# Patient Record
Sex: Female | Born: 1985 | Race: White | Hispanic: No | Marital: Married | Smoking: Never smoker
Health system: Southern US, Community
[De-identification: ages and names within clinical notes are randomized; demographics above are authoritative.]

## PROBLEM LIST (undated history)

## (undated) DIAGNOSIS — F419 Anxiety disorder, unspecified: Secondary | ICD-10-CM

## (undated) HISTORY — PX: NO PAST SURGERIES: SHX2092

---

## 2020-01-23 LAB — OB RESULTS CONSOLE GBS: GBS: POSITIVE

## 2020-07-18 LAB — OB RESULTS CONSOLE RUBELLA ANTIBODY, IGM: Rubella: IMMUNE

## 2020-07-18 LAB — OB RESULTS CONSOLE HEPATITIS B SURFACE ANTIGEN: Hepatitis B Surface Ag: NEGATIVE

## 2020-07-18 LAB — OB RESULTS CONSOLE HIV ANTIBODY (ROUTINE TESTING): HIV: NONREACTIVE

## 2020-07-18 LAB — OB RESULTS CONSOLE GC/CHLAMYDIA
Chlamydia: NEGATIVE
Gonorrhea: NEGATIVE

## 2020-07-23 ENCOUNTER — Inpatient Hospital Stay (HOSPITAL_COMMUNITY): Admit: 2020-07-23 | Payer: BC Managed Care – PPO | Admitting: Obstetrics & Gynecology

## 2020-11-16 NOTE — L&D Delivery Note (Signed)
Delivery Note At 11:20 AM a viable female was delivered via Vaginal, Spontaneous (Presentation: Left Occiput Anterior).  APGAR: pend; weight  .   Placenta status: Spontaneous, Intact.  Cord: 3 vessels with the following complications: None.  Cord pH: pend.  Uterus manually explored, clear of all clot and debris  Anesthesia:  epidural Episiotomy:   Lacerations:  1st degree, periurethral (hemostatic, no suture required) Suture Repair: 3.0 Est. Blood Loss (mL): 150 cc   Mom to postpartum.  Baby to Couplet care / Skin to Skin.  [name] 02/11/2021, 11:45 AM

## 2020-12-31 ENCOUNTER — Encounter (HOSPITAL_COMMUNITY): Payer: Self-pay

## 2020-12-31 ENCOUNTER — Inpatient Hospital Stay (HOSPITAL_BASED_OUTPATIENT_CLINIC_OR_DEPARTMENT_OTHER): Payer: BC Managed Care – PPO

## 2020-12-31 ENCOUNTER — Inpatient Hospital Stay (HOSPITAL_COMMUNITY)
Admission: AD | Admit: 2020-12-31 | Discharge: 2020-12-31 | Disposition: A | Discharge status: Home / Self Care | Payer: BC Managed Care – PPO | Attending: Obstetrics and Gynecology | Admitting: Obstetrics and Gynecology

## 2020-12-31 ENCOUNTER — Other Ambulatory Visit: Payer: Self-pay

## 2020-12-31 DIAGNOSIS — F419 Anxiety disorder, unspecified: Secondary | ICD-10-CM | POA: Diagnosis not present

## 2020-12-31 DIAGNOSIS — O368130 Decreased fetal movements, third trimester, not applicable or unspecified: Secondary | ICD-10-CM

## 2020-12-31 DIAGNOSIS — O99343 Other mental disorders complicating pregnancy, third trimester: Secondary | ICD-10-CM | POA: Diagnosis not present

## 2020-12-31 DIAGNOSIS — Z3A33 33 weeks gestation of pregnancy: Secondary | ICD-10-CM

## 2020-12-31 DIAGNOSIS — O368190 Decreased fetal movements, unspecified trimester, not applicable or unspecified: Secondary | ICD-10-CM

## 2020-12-31 HISTORY — DX: Anxiety disorder, unspecified: F41.9

## 2020-12-31 NOTE — Discharge Instructions (Signed)
Fetal Movement Counts Patient Name: ________________________________________________ Patient Due Date: ____________________  What is a fetal movement count? A fetal movement count is the number of times that you feel your baby move during a certain amount of time. This may also be called a fetal kick count. A fetal movement count is recommended for every pregnant woman. You may be asked to start counting fetal movements as early as week 28 of your pregnancy. Pay attention to when your baby is most active. You may notice your baby's sleep and wake cycles. You may also notice things that make your baby move more. You should do a fetal movement count:  When your baby is normally most active.  At the same time each day. A good time to count movements is while you are resting, after having something to eat and drink. How do I count fetal movements? 1. Find a quiet, comfortable area. Sit, or lie down on your side. 2. Write down the date, the start time and stop time, and the number of movements that you felt between those two times. Take this information with you to your health care visits. 3. Write down your start time when you feel the first movement. 4. Count kicks, flutters, swishes, rolls, and jabs. You should feel at least 10 movements. 5. You may stop counting after you have felt 10 movements, or if you have been counting for 2 hours. Write down the stop time. 6. If you do not feel 10 movements in 2 hours, contact your health care provider for further instructions. Your health care provider may want to do additional tests to assess your baby's well-being. Contact a health care provider if:  You feel fewer than 10 movements in 2 hours.  Your baby is not moving like he or she usually does. Date: ____________ Start time: ____________ Stop time: ____________ Movements: ____________ Date: ____________ Start time: ____________ Stop time: ____________ Movements: ____________ Date: ____________  Start time: ____________ Stop time: ____________ Movements: ____________ Date: ____________ Start time: ____________ Stop time: ____________ Movements: ____________ Date: ____________ Start time: ____________ Stop time: ____________ Movements: ____________ Date: ____________ Start time: ____________ Stop time: ____________ Movements: ____________ Date: ____________ Start time: ____________ Stop time: ____________ Movements: ____________ Date: ____________ Start time: ____________ Stop time: ____________ Movements: ____________ Date: ____________ Start time: ____________ Stop time: ____________ Movements: ____________ This information is not intended to replace advice given to you by your health care provider. Make sure you discuss any questions you have with your health care provider. Document Revised: 06/22/2019 Document Reviewed: 06/22/2019 Elsevier Patient Education  2021 Elsevier Inc.        [place].czhttps://www.nichd.nih.gov/health/topics/labor-delivery/Pages/default.aspx">  Third Trimester of Pregnancy  The third trimester of pregnancy is from week 28 through week 40. This is months 7 through 9. The third trimester is a time when the unborn baby (fetus) is growing rapidly. At the end of the ninth month, the fetus is about 20 inches long and weighs 6-10 pounds. Body changes during your third trimester During the third trimester, your body will continue to go through many changes. The changes vary and generally return to normal after your baby is born. Physical changes  Your weight will continue to increase. You can expect to gain 25-35 pounds (11-16 kg) by the end of the pregnancy if you begin pregnancy at a normal weight. If you are underweight, you can expect to gain 28-40 lb (about 13-18 kg), and if you are overweight, you can expect to gain 15-25 lb (about 7-11 kg).  You may begin to get stretch marks on your hips, abdomen, and breasts.  Your breasts will continue to grow and may hurt.  A yellow fluid (colostrum) may leak from your breasts. This is the first milk you are producing for your baby.  You may have changes in your hair. These can include thickening of your hair, rapid growth, and changes in texture. Some people also have hair loss during or after pregnancy, or hair that feels dry or thin.  Your belly button may stick out.  You may notice more swelling in your hands, face, or ankles. Health changes  You may have heartburn.  You may have constipation.  You may develop hemorrhoids.  You may develop swollen, bulging veins (varicose veins) in your legs.  You may have increased body aches in the pelvis, back, or thighs. This is due to weight gain and increased hormones that are relaxing your joints.  You may have increased tingling or numbness in your hands, arms, and legs. The skin on your abdomen may also feel numb.  You may feel short of breath because of your expanding uterus. Other changes  You may urinate more often because the fetus is moving lower into your pelvis and pressing on your bladder.  You may have more problems sleeping. This may be caused by the size of your abdomen, an increased need to urinate, and an increase in your body's metabolism.  You may notice the fetus "dropping," or moving lower in your abdomen (lightening).  You may have increased vaginal discharge.  You may notice that you have pain around your pelvic bone as your uterus distends. Follow these instructions at home: Medicines  Follow your health care provider's instructions regarding medicine use. Specific medicines may be either safe or unsafe to take during pregnancy. Do not take any medicines unless approved by your health care provider.  Take a prenatal vitamin that contains at least 600 micrograms (mcg) of folic acid. Eating and drinking  Eat a healthy diet that includes fresh fruits and vegetables, whole grains, good sources of protein such as meat, eggs, or tofu,  and low-fat dairy products.  Avoid raw meat and unpasteurized juice, milk, and cheese. These carry germs that can harm you and your baby.  Eat 4 or 5 small meals rather than 3 large meals a day.  You may need to take these actions to prevent or treat constipation: ? Drink enough fluid to keep your urine pale yellow. ? Eat foods that are high in fiber, such as beans, whole grains, and fresh fruits and vegetables. ? Limit foods that are high in fat and processed sugars, such as fried or sweet foods. Activity  Exercise only as directed by your health care provider. Most people can continue their usual exercise routine during pregnancy. Try to exercise for 30 minutes at least 5 days a week. Stop exercising if you experience contractions in the uterus.  Stop exercising if you develop pain or cramping in the lower abdomen or lower back.  Avoid heavy lifting.  Do not exercise if it is very hot or humid or if you are at a high altitude.  If you choose to, you may continue to have sex unless your health care provider tells you not to. Relieving pain and discomfort  Take frequent breaks and rest with your legs raised (elevated) if you have leg cramps or low back pain.  Take warm sitz baths to soothe any pain or discomfort caused by hemorrhoids. Use hemorrhoid cream  if your health care provider approves.  Wear a supportive bra to prevent discomfort from breast tenderness.  If you develop varicose veins: ? Wear support hose as told by your health care provider. ? Elevate your feet for 15 minutes, 3-4 times a day. ? Limit salt in your diet. Safety  Talk to your health care provider before traveling far distances.  Do not use hot tubs, steam rooms, or saunas.  Wear your seat belt at all times when driving or riding in a car.  Talk with your health care provider if someone is verbally or physically abusive to you. Preparing for birth To prepare for the arrival of your baby:  Take  prenatal classes to understand, practice, and ask questions about labor and delivery.  Visit the hospital and tour the maternity area.  Purchase a rear-facing car seat and make sure you know how to install it in your car.  Prepare the baby's room or sleeping area. Make sure to remove all pillows and stuffed animals from the baby's crib to prevent suffocation. General instructions  Avoid cat litter boxes and soil used by cats. These carry germs that can cause birth defects in the baby. If you have a cat, ask someone to clean the litter box for you.  Do not douche or use tampons. Do not use scented sanitary pads.  Do not use any products that contain nicotine or tobacco, such as cigarettes, e-cigarettes, and chewing tobacco. If you need help quitting, ask your health care provider.  Do not use any herbal remedies, illegal drugs, or medicines that were not prescribed to you. Chemicals in these products can harm your baby.  Do not drink alcohol.  You will have more frequent prenatal exams during the third trimester. During a routine prenatal visit, your health care provider will do a physical exam, perform tests, and discuss your overall health. Keep all follow-up visits. This is important. Where to find more information  American Pregnancy Association: americanpregnancy.org  [employer]: https://www.todd-brady.net/  Office on [employer] Health: Might[employer]ard.co.nz Contact a health care provider if you have:  A fever.  Mild pelvic cramps, pelvic pressure, or nagging pain in your abdominal area or lower back.  Vomiting or diarrhea.  Bad-smelling vaginal discharge or foul-smelling urine.  Pain when you urinate.  A headache that does not go away when you take medicine.  Visual changes or see spots in front of your eyes. Get help right away if:  Your water breaks.  You have regular contractions less than 5 minutes  apart.  You have spotting or bleeding from your vagina.  You have severe abdominal pain.  You have difficulty breathing.  You have chest pain.  You have fainting spells.  You have not felt your baby move for the time period told by your health care provider.  You have new or increased pain, swelling, or redness in an arm or leg. Summary  The third trimester of pregnancy is from week 28 through week 40 (months 7 through 9).  You may have more problems sleeping. This can be caused by the size of your abdomen, an increased need to urinate, and an increase in your body's metabolism.  You will have more frequent prenatal exams during the third trimester. Keep all follow-up visits. This is important. This information is not intended to replace advice given to you by your health care provider. Make sure you discuss any questions you have with your health care provider. Document Revised: 04/10/2020  Document Reviewed: 02/15/2020 Elsevier Patient Education  2021 [employer].

## 2020-12-31 NOTE — MAU Note (Signed)
Presents with c/o decreased FM since yesterday.  States has felt movement, less than usual.  Denies VB or LOF.

## 2020-12-31 NOTE — MAU Provider Note (Signed)
History     CSN: [id number]  Arrival date and time: 12/31/20 1743   Event Date/Time   First Provider Initiated Contact with Patient 12/31/20 1839      Chief Complaint  Patient presents with  . Decreased Fetal Movement   Veronica Gutierrez is a 35 y.o. G5P1031 at [redacted]w[redacted]d who presents to MAU for feeling decreased fetal movement beginning today. Patient reports she does do fetal kick counts at home and was told to count 10 movements in 1 hour, but patient was concerned because the baby normally moves 10 times in 15 minutes, so she came in to MAU for evaluation. Patient reports almost normal return to fetal movement upon provider entering room.  Current medications/supplements: sertraline, Unisom QHS, PNVs Anterior placenta? unsure Doing FKCs? yes Problems this pregnancy include: none Pt denies prior instances of DFM. Pt denies all risk factors for stillbirth, including, but not limited to: IUGR, placental abruption, infection, genetic/congenital anomalies, fetomaternal hemorrhage, DM, HTN, smoking/drug use, umbilical cord/placental abnormalities, uterine abnormalities, fetal hydrops, arrythmia, platelet dysfunction, IHCP.  Pt denies VB, LOF, ctx, vaginal discharge/odor/itching.   OB History    Gravida  5   Para  1   Term  1   Preterm      AB  3   Living  1     SAB  3   IAB      Ectopic      Multiple      Live Births  1           Past Medical History:  Diagnosis Date  . Anxiety     Past Surgical History:  Procedure Laterality Date  . NO PAST SURGERIES      Family History  Problem Relation Age of Onset  . Cancer Mother        Breast  . Healthy Father     Social History   Tobacco Use  . Smoking status: Never Smoker  . Smokeless tobacco: Never Used  Vaping Use  . Vaping Use: Never used  Substance Use Topics  . Alcohol use: Not Currently    Comment: Social  . Drug use: Never    Allergies: Not on File  Medications Prior to Admission   Medication Sig Dispense Refill Last Dose  . doxylamine, Sleep, (UNISOM) 25 MG tablet Take 25 mg by mouth at bedtime as needed.   12/30/2020 at Unknown time  . Prenatal Vit-Fe Fumarate-FA (MULTIVITAMIN-PRENATAL) 27-0.8 MG TABS tablet Take 1 tablet by mouth daily at 12 noon.   12/30/2020 at Unknown time  . sertraline (ZOLOFT) 50 MG tablet Take 50 mg by mouth daily.   12/30/2020 at Unknown time    Review of Systems  Constitutional: Negative for chills, diaphoresis, fatigue and fever.  Eyes: Negative for visual disturbance.  Respiratory: Negative for shortness of breath.   Cardiovascular: Negative for chest pain.  Gastrointestinal: Negative for abdominal pain, constipation, diarrhea, nausea and vomiting.  Genitourinary: Negative for dysuria, flank pain, frequency, pelvic pain, urgency, vaginal bleeding and vaginal discharge.  Neurological: Negative for dizziness, weakness, light-headedness and headaches.   Physical Exam   Blood pressure 102/73, pulse 98, temperature 97.9 F (36.6 C), temperature source Oral, resp. rate 20, SpO2 98 %.  Physical Exam Vitals and nursing note reviewed.  Constitutional:      General: She is not in acute distress.    Appearance: Normal appearance. She is not ill-appearing, toxic-appearing or diaphoretic.  HENT:     Head: Normocephalic and atraumatic.  Pulmonary:  Effort: Pulmonary effort is normal.  Neurological:     Mental Status: She is alert and oriented to person, place, and time.  Psychiatric:        Mood and Affect: Mood normal.        Behavior: Behavior normal.        Thought Content: Thought content normal.        Judgment: Judgment normal.    No results found for this or any previous visit (from the past 24 hour(s)).  No results found.  MAU Course  Procedures  MDM -DFM since this morning, with near return to normal fetal movement upon provider entering room -clicker given to patient to record fetal movement, patient pressed button 23  times in 36 minutes -EFM: reactive       -baseline: 145       -variability: moderate       -accels: present, 15x15       -decels: absent       -TOCO: quiet -BPP ordered, 8/8 -pt discharged to home in stable condition  Orders Placed This Encounter  Procedures  . [place] MFM FETAL BPP WO NON STRESS    Standing Status:   Standing    Number of Occurrences:   1    Order Specific Question:   Symptom/Reason for Exam    Answer:   Decreased fetal movement [[id number]]  . Discharge patient    Order Specific Question:   Discharge disposition    Answer:   01-Home or Self Care [1]    Order Specific Question:   Discharge patient date    Answer:   12/31/2020   No orders of the defined types were placed in this encounter.  Assessment and Plan   1. Decreased fetal movements in third trimester, single or unspecified fetus   2. Decreased fetal movement   3. [redacted] weeks gestation of pregnancy    Allergies as of 12/31/2020   Not on File     Medication List    TAKE these medications   doxylamine (Sleep) 25 MG tablet Commonly known as: UNISOM Take 25 mg by mouth at bedtime as needed.   multivitamin-prenatal 27-0.8 MG Tabs tablet Take 1 tablet by mouth daily at 12 noon.   sertraline 50 MG tablet Commonly known as: ZOLOFT Take 50 mg by mouth daily.      -discussed that mothers do not perceive 100% of fetal movement, and there is wide variation of mother perception of fetal movement -discussed that fetal movement varies somewhat depending on the time of day and gestational age and there is a wide variety of normal movement among healthy babies -discussed that frequency of movement typically increases from morning to night, with peak activity late at night -discussed that fetal sleep cycles become longer with advancing gestation and can last about 20-53minutes -pt advised to contact HCP immediately if noticing a decrease in fetal movement compared to what is normal -discussed FKCs vs. monitoring  movements; can either do fetal kick counting, or just be aware of what is normal for baby in terms of movement; no one method is better than the other -FKC: at least 10 fetal movements (FMs) over up to two hours when at rest and focused on counting -return MAU precautions given -pt discharged to home in stable condition  Joni Reining E Nugent 12/31/2020, 7:30 PM

## 2021-01-22 LAB — OB RESULTS CONSOLE GBS: GBS: POSITIVE

## 2021-02-07 ENCOUNTER — Other Ambulatory Visit: Payer: Self-pay

## 2021-02-07 ENCOUNTER — Inpatient Hospital Stay (HOSPITAL_COMMUNITY)
Admission: AD | Admit: 2021-02-07 | Discharge: 2021-02-07 | Disposition: A | Discharge status: Home / Self Care | Payer: BC Managed Care – PPO | Attending: Obstetrics and Gynecology | Admitting: Obstetrics and Gynecology

## 2021-02-07 ENCOUNTER — Encounter (HOSPITAL_COMMUNITY): Payer: Self-pay | Admitting: Obstetrics and Gynecology

## 2021-02-07 DIAGNOSIS — Z3A38 38 weeks gestation of pregnancy: Secondary | ICD-10-CM | POA: Diagnosis not present

## 2021-02-07 DIAGNOSIS — Z0371 Encounter for suspected problem with amniotic cavity and membrane ruled out: Secondary | ICD-10-CM | POA: Diagnosis present

## 2021-02-07 LAB — POCT FERN TEST: POCT Fern Test: NEGATIVE

## 2021-02-07 NOTE — Discharge Instructions (Signed)
Fetal Movement Counts Patient Name: ________________________________________________ Patient Due Date: ____________________  What is a fetal movement count? A fetal movement count is the number of times that you feel your baby move during a certain amount of time. This may also be called a fetal kick count. A fetal movement count is recommended for every pregnant woman. You may be asked to start counting fetal movements as early as week 28 of your pregnancy. Pay attention to when your baby is most active. You may notice your baby's sleep and wake cycles. You may also notice things that make your baby move more. You should do a fetal movement count:  When your baby is normally most active.  At the same time each day. A good time to count movements is while you are resting, after having something to eat and drink. How do I count fetal movements? 1. Find a quiet, comfortable area. Sit, or lie down on your side. 2. Write down the date, the start time and stop time, and the number of movements that you felt between those two times. Take this information with you to your health care visits. 3. Write down your start time when you feel the first movement. 4. Count kicks, flutters, swishes, rolls, and jabs. You should feel at least 10 movements. 5. You may stop counting after you have felt 10 movements, or if you have been counting for 2 hours. Write down the stop time. 6. If you do not feel 10 movements in 2 hours, contact your health care provider for further instructions. Your health care provider may want to do additional tests to assess your baby's well-being. Contact a health care provider if:  You feel fewer than 10 movements in 2 hours.  Your baby is not moving like he or she usually does. Date: ____________ Start time: ____________ Stop time: ____________ Movements: ____________ Date: ____________ Start time: ____________ Stop time: ____________ Movements: ____________ Date: ____________  Start time: ____________ Stop time: ____________ Movements: ____________ Date: ____________ Start time: ____________ Stop time: ____________ Movements: ____________ Date: ____________ Start time: ____________ Stop time: ____________ Movements: ____________ Date: ____________ Start time: ____________ Stop time: ____________ Movements: ____________ Date: ____________ Start time: ____________ Stop time: ____________ Movements: ____________ Date: ____________ Start time: ____________ Stop time: ____________ Movements: ____________ Date: ____________ Start time: ____________ Stop time: ____________ Movements: ____________ This information is not intended to replace advice given to you by your health care provider. Make sure you discuss any questions you have with your health care provider. Document Revised: 06/22/2019 Document Reviewed: 06/22/2019 Elsevier Patient Education  2021 [employer] Signs and Symptoms of Labor Labor is the body's natural process of moving the baby and the placenta out of the uterus. The process of labor usually starts when the baby is full-term, between 37 and 40 weeks of pregnancy. Signs and symptoms that you are close to going into labor As your body prepares for labor and the birth of your baby, you may notice the following symptoms in the weeks and days before true labor starts:  Passing a small amount of thick, bloody mucus from your vagina. This is called normal bloody show or losing your mucus plug. This may happen more than a week before labor begins, or right before labor begins, as the opening of the cervix starts to widen (dilate). For some women, the entire mucus plug passes at once. For others, pieces of the mucus plug may gradually pass over several days.  Your baby moving (dropping) lower in your pelvis   to get into position for birth (lightening). When this happens, you may feel more pressure on your bladder and pelvic bone and less pressure on your ribs. This  may make it easier to breathe. It may also cause you to need to urinate more often and have problems with bowel movements.  Having "practice contractions," also called Braxton Hicks contractions or false labor. These occur at irregular (unevenly spaced) intervals that are more than 10 minutes apart. False labor contractions are common after exercise or sexual activity. They will stop if you change position, rest, or drink fluids. These contractions are usually mild and do not get stronger over time. They may feel like: ? A backache or back pain. ? Mild cramps, similar to menstrual cramps. ? Tightening or pressure in your abdomen. Other early symptoms include:  Nausea or loss of appetite.  Diarrhea.  Having a sudden burst of energy, or feeling very tired.  Mood changes.  Having trouble sleeping.   Signs and symptoms that labor has begun Signs that you are in labor may include:  Having contractions that come at regular (evenly spaced) intervals and increase in intensity. This may feel like more intense tightening or pressure in your abdomen that moves to your back. ? Contractions may also feel like rhythmic pain in your upper thighs or back that comes and goes at regular intervals. ? For first-time mothers, this change in intensity of contractions often occurs at a more gradual pace. ? Women who have given birth before may notice a more rapid progression of contraction changes.  Feeling pressure in the vaginal area.  Your water breaking (rupture of membranes). This is when the sac of fluid that surrounds your baby breaks. Fluid leaking from your vagina may be clear or blood-tinged. Labor usually starts within 24 hours of your water breaking, but it may take longer to begin. ? Some women may feel a sudden gush of fluid. ? Others notice that their underwear repeatedly becomes damp. Follow these instructions at home:  When labor starts, or if your water breaks, call your health care  provider or nurse care line. Based on your situation, they will determine when you should go in for an exam.  During early labor, you may be able to rest and manage symptoms at home. Some strategies to try at home include: ? Breathing and relaxation techniques. ? Taking a warm bath or shower. ? Listening to music. ? Using a heating pad on the lower back for pain. If you are directed to use heat:  Place a towel between your skin and the heat source.  Leave the heat on for 20-30 minutes.  Remove the heat if your skin turns bright red. This is especially important if you are unable to feel pain, heat, or cold. You may have a greater risk of getting burned.   Contact a health care provider if:  Your labor has started.  Your water breaks. Get help right away if:  You have painful, regular contractions that are 5 minutes apart or less.  Labor starts before you are [redacted] weeks along in your pregnancy.  You have a fever.  You have bright red blood coming from your vagina.  You do not feel your baby moving.  You have a severe headache with or without vision problems.  You have severe nausea, vomiting, or diarrhea.  You have chest pain or shortness of breath. These symptoms may represent a serious problem that is an emergency. Do not wait to see   if the symptoms will go away. Get medical help right away. Call your local emergency services (911 in the [place] Do not drive yourself to the hospital. Summary  Labor is your body's natural process of moving your baby and the placenta out of your uterus.  The process of labor usually starts when your baby is full-term, between 37 and 40 weeks of pregnancy.  When labor starts, or if your water breaks, call your health care provider or nurse care line. Based on your situation, they will determine when you should go in for an exam. This information is not intended to replace advice given to you by your health care provider. Make sure you discuss  any questions you have with your health care provider. Document Revised: 08/24/2020 Document Reviewed: 08/24/2020 Elsevier Patient Education  2021 Elsevier Inc.  

## 2021-02-07 NOTE — MAU Provider Note (Signed)
Event Date/Time   First Provider Initiated Contact with Patient 02/07/21 1002       S: Ms. Antonela Freiman is a 35 y.o. G5P1031 at [redacted]w[redacted]d  who presents to MAU today complaining of leaking of fluid since this morning. She denies vaginal bleeding. She denies contractions. She reports normal fetal movement.    O: BP 112/76   Pulse (!) 110   SpO2 99%  GENERAL: Well-developed, well-nourished female in no acute distress.  HEAD: Normocephalic, atraumatic.  CHEST: Normal effort of breathing, regular heart rate ABDOMEN: Soft, nontender, gravid PELVIC: Normal external female genitalia. Vagina is pink and rugated. Cervix with normal contour, no lesions. Normal discharge.  No pooling.   Cervical exam: deferred     Fetal Monitoring: Baseline: 140 Variability: moderate Accelerations: 15x15 Decelerations: none Contractions: none  Results for orders placed or performed during the hospital encounter of 02/07/21 (from the past 24 hour(s))  Fern Test     Status: Normal   Collection Time: 02/07/21 10:10 AM  Result Value Ref Range   POCT Fern Test Negative = intact amniotic membranes      A: SIUP at [redacted]w[redacted]d  Membranes intact  P: Discharged home with return precautions  Judeth Horn, NP 02/07/2021 10:11 AM

## 2021-02-07 NOTE — MAU Note (Signed)
...  Veronica Gutierrez is a 35 y.o. at [redacted]w[redacted]d here in MAU reporting: she woke up at 0630 this morning and noticed she had leaked fluids. She reports she has leaked once since then. Clear fluids, no odor. +FM but feels as if it is less but reports "I have been running around and not paying attention much." No VB. No CTX. Endorses a HA.  Pain score: 2/10 headache FHR: External 145

## 2021-02-11 ENCOUNTER — Inpatient Hospital Stay (HOSPITAL_COMMUNITY): Payer: BC Managed Care – PPO | Admitting: Anesthesiology

## 2021-02-11 ENCOUNTER — Encounter (HOSPITAL_COMMUNITY): Payer: Self-pay | Admitting: Obstetrics and Gynecology

## 2021-02-11 ENCOUNTER — Inpatient Hospital Stay (HOSPITAL_COMMUNITY)
Admission: AD | Admit: 2021-02-11 | Discharge: 2021-02-12 | DRG: 807 | Disposition: A | Discharge status: Home / Self Care | Payer: BC Managed Care – PPO | Attending: Obstetrics and Gynecology | Admitting: Obstetrics and Gynecology

## 2021-02-11 DIAGNOSIS — Z3A39 39 weeks gestation of pregnancy: Secondary | ICD-10-CM

## 2021-02-11 DIAGNOSIS — O99824 Streptococcus B carrier state complicating childbirth: Principal | ICD-10-CM | POA: Diagnosis present

## 2021-02-11 DIAGNOSIS — O26893 Other specified pregnancy related conditions, third trimester: Secondary | ICD-10-CM | POA: Diagnosis present

## 2021-02-11 LAB — CBC
HCT: 36.2 % (ref 36.0–46.0)
Hemoglobin: 12.7 g/dL (ref 12.0–15.0)
MCH: 31.4 pg (ref 26.0–34.0)
MCHC: 35.1 g/dL (ref 30.0–36.0)
MCV: 89.6 fL (ref 80.0–100.0)
Platelets: 172 10*3/uL (ref 150–400)
RBC: 4.04 MIL/uL (ref 3.87–5.11)
RDW: 13.2 % (ref 11.5–15.5)
WBC: 7.7 10*3/uL (ref 4.0–10.5)
nRBC: 0 % (ref 0.0–0.2)

## 2021-02-11 LAB — TYPE AND SCREEN
ABO/RH(D): O POS
Antibody Screen: NEGATIVE

## 2021-02-11 LAB — RPR: RPR Ser Ql: NONREACTIVE

## 2021-02-11 MED ORDER — ONDANSETRON HCL 4 MG/2ML IJ SOLN: 4.0000 mg | INTRAMUSCULAR | Status: DC | PRN

## 2021-02-11 MED ORDER — FENTANYL CITRATE (PF) 100 MCG/2ML IJ SOLN
100.0000 ug | INTRAMUSCULAR | Status: DC | PRN
  Administered 2021-02-11: 100 ug via INTRAVENOUS
  Filled 2021-02-11: qty 2

## 2021-02-11 MED ORDER — DIPHENHYDRAMINE HCL 50 MG/ML IJ SOLN: 12.5000 mg | INTRAMUSCULAR | Status: DC | PRN

## 2021-02-11 MED ORDER — LACTATED RINGERS IV SOLN: INTRAVENOUS | Status: DC

## 2021-02-11 MED ORDER — ZOLPIDEM TARTRATE 5 MG PO TABS: 5.0000 mg | ORAL_TABLET | Freq: Every evening | ORAL | Status: DC | PRN

## 2021-02-11 MED ORDER — PRENATAL MULTIVITAMIN CH
1.0000 | ORAL_TABLET | Freq: Every day | ORAL | Status: DC
  Filled 2021-02-11: qty 1

## 2021-02-11 MED ORDER — SERTRALINE HCL 100 MG PO TABS
100.0000 mg | ORAL_TABLET | Freq: Every day | ORAL | Status: DC
  Administered 2021-02-11: 100 mg via ORAL
  Filled 2021-02-11: qty 1

## 2021-02-11 MED ORDER — FLEET ENEMA 7-19 GM/118ML RE ENEM: 1.0000 | ENEMA | RECTAL | Status: DC | PRN

## 2021-02-11 MED ORDER — FENTANYL-BUPIVACAINE-NACL 0.5-0.125-0.9 MG/250ML-% EP SOLN
EPIDURAL | Status: DC | PRN
  Administered 2021-02-11: 12 mL/h via EPIDURAL

## 2021-02-11 MED ORDER — OXYTOCIN-SODIUM CHLORIDE 30-0.9 UT/500ML-% IV SOLN
1.0000 m[IU]/min | INTRAVENOUS | Status: DC
  Administered 2021-02-11: 2 m[IU]/min via INTRAVENOUS
  Filled 2021-02-11: qty 500

## 2021-02-11 MED ORDER — SOD CITRATE-CITRIC ACID 500-334 MG/5ML PO SOLN: 30.0000 mL | ORAL | Status: DC | PRN

## 2021-02-11 MED ORDER — TERBUTALINE SULFATE 1 MG/ML IJ SOLN: 0.2500 mg | Freq: Once | INTRAMUSCULAR | Status: DC | PRN

## 2021-02-11 MED ORDER — ONDANSETRON HCL 4 MG PO TABS: 4.0000 mg | ORAL_TABLET | ORAL | Status: DC | PRN

## 2021-02-11 MED ORDER — PENICILLIN G POT IN DEXTROSE 60000 UNIT/ML IV SOLN
3.0000 10*6.[IU] | INTRAVENOUS | Status: DC
  Administered 2021-02-11: 3 10*6.[IU] via INTRAVENOUS
  Filled 2021-02-11: qty 50

## 2021-02-11 MED ORDER — EPHEDRINE 5 MG/ML INJ: 10.0000 mg | INTRAVENOUS | Status: DC | PRN

## 2021-02-11 MED ORDER — WITCH HAZEL-GLYCERIN EX PADS: 1.0000 "application " | MEDICATED_PAD | CUTANEOUS | Status: DC | PRN

## 2021-02-11 MED ORDER — DOXYLAMINE SUCCINATE (SLEEP) 25 MG PO TABS
25.0000 mg | ORAL_TABLET | Freq: Every evening | ORAL | Status: DC | PRN
  Filled 2021-02-11: qty 1

## 2021-02-11 MED ORDER — BENZOCAINE-MENTHOL 20-0.5 % EX AERO
1.0000 "application " | INHALATION_SPRAY | CUTANEOUS | Status: DC | PRN
  Administered 2021-02-11: 1 via TOPICAL
  Filled 2021-02-11: qty 56

## 2021-02-11 MED ORDER — DIBUCAINE (PERIANAL) 1 % EX OINT: 1.0000 "application " | TOPICAL_OINTMENT | CUTANEOUS | Status: DC | PRN

## 2021-02-11 MED ORDER — OXYTOCIN-SODIUM CHLORIDE 30-0.9 UT/500ML-% IV SOLN
2.5000 [IU]/h | INTRAVENOUS | Status: DC
  Administered 2021-02-11: 2.5 [IU]/h via INTRAVENOUS

## 2021-02-11 MED ORDER — METHYLERGONOVINE MALEATE 0.2 MG/ML IJ SOLN
0.2000 mg | Freq: Once | INTRAMUSCULAR | Status: AC
  Administered 2021-02-11: 0.2 mg via INTRAMUSCULAR

## 2021-02-11 MED ORDER — OXYTOCIN BOLUS FROM INFUSION
333.0000 mL | Freq: Once | INTRAVENOUS | Status: AC
  Administered 2021-02-11: 333 mL via INTRAVENOUS

## 2021-02-11 MED ORDER — ACETAMINOPHEN 325 MG PO TABS
650.0000 mg | ORAL_TABLET | ORAL | Status: DC | PRN
  Administered 2021-02-11: 650 mg via ORAL
  Filled 2021-02-11: qty 2

## 2021-02-11 MED ORDER — LACTATED RINGERS IV SOLN: 500.0000 mL | INTRAVENOUS | Status: DC | PRN

## 2021-02-11 MED ORDER — SODIUM CHLORIDE 0.9 % IV SOLN
5.0000 10*6.[IU] | Freq: Once | INTRAVENOUS | Status: AC
  Administered 2021-02-11: 5 10*6.[IU] via INTRAVENOUS
  Filled 2021-02-11 (×2): qty 5

## 2021-02-11 MED ORDER — FENTANYL-BUPIVACAINE-NACL 0.5-0.125-0.9 MG/250ML-% EP SOLN
12.0000 mL/h | EPIDURAL | Status: DC | PRN
Start: 2021-02-11 — End: 2021-02-11
  Filled 2021-02-11: qty 250

## 2021-02-11 MED ORDER — IBUPROFEN 600 MG PO TABS
600.0000 mg | ORAL_TABLET | Freq: Four times a day (QID) | ORAL | Status: DC
  Administered 2021-02-11 – 2021-02-12 (×4): 600 mg via ORAL
  Filled 2021-02-11 (×4): qty 1

## 2021-02-11 MED ORDER — PHENYLEPHRINE 40 MCG/ML (10ML) SYRINGE FOR IV PUSH (FOR BLOOD PRESSURE SUPPORT): 80.0000 ug | PREFILLED_SYRINGE | INTRAVENOUS | Status: DC | PRN

## 2021-02-11 MED ORDER — LACTATED RINGERS IV SOLN
500.0000 mL | Freq: Once | INTRAVENOUS | Status: AC
  Administered 2021-02-11: 500 mL via INTRAVENOUS

## 2021-02-11 MED ORDER — SENNOSIDES-DOCUSATE SODIUM 8.6-50 MG PO TABS
2.0000 | ORAL_TABLET | ORAL | Status: DC
  Administered 2021-02-11 – 2021-02-12 (×2): 2 via ORAL
  Filled 2021-02-11 (×2): qty 2

## 2021-02-11 MED ORDER — LIDOCAINE HCL (PF) 1 % IJ SOLN
INTRAMUSCULAR | Status: DC | PRN
  Administered 2021-02-11: 5 mL via EPIDURAL

## 2021-02-11 MED ORDER — OXYCODONE-ACETAMINOPHEN 5-325 MG PO TABS: 2.0000 | ORAL_TABLET | ORAL | Status: DC | PRN

## 2021-02-11 MED ORDER — ACETAMINOPHEN 325 MG PO TABS
650.0000 mg | ORAL_TABLET | ORAL | Status: DC | PRN
  Administered 2021-02-11 – 2021-02-12 (×4): 650 mg via ORAL
  Filled 2021-02-11 (×4): qty 2

## 2021-02-11 MED ORDER — TETANUS-DIPHTH-ACELL PERTUSSIS 5-2.5-18.5 LF-MCG/0.5 IM SUSY: 0.5000 mL | PREFILLED_SYRINGE | Freq: Once | INTRAMUSCULAR | Status: DC

## 2021-02-11 MED ORDER — SIMETHICONE 80 MG PO CHEW: 80.0000 mg | CHEWABLE_TABLET | ORAL | Status: DC | PRN

## 2021-02-11 MED ORDER — METHYLERGONOVINE MALEATE 0.2 MG/ML IJ SOLN
INTRAMUSCULAR | Status: AC
  Filled 2021-02-11: qty 1

## 2021-02-11 MED ORDER — OXYCODONE-ACETAMINOPHEN 5-325 MG PO TABS: 1.0000 | ORAL_TABLET | ORAL | Status: DC | PRN

## 2021-02-11 MED ORDER — LIDOCAINE HCL (PF) 1 % IJ SOLN: 30.0000 mL | INTRAMUSCULAR | Status: DC | PRN

## 2021-02-11 MED ORDER — ONDANSETRON HCL 4 MG/2ML IJ SOLN: 4.0000 mg | Freq: Four times a day (QID) | INTRAMUSCULAR | Status: DC | PRN

## 2021-02-11 MED ORDER — DIPHENHYDRAMINE HCL 25 MG PO CAPS: 25.0000 mg | ORAL_CAPSULE | Freq: Four times a day (QID) | ORAL | Status: DC | PRN

## 2021-02-11 MED ORDER — COCONUT OIL OIL: 1.0000 "application " | TOPICAL_OIL | Status: DC | PRN

## 2021-02-11 NOTE — H&P (Signed)
OB History and Physical   Veronica Gutierrez is a 35 y.o. female [phone] presenting for SROM at [redacted]w[redacted]d.  Pregnancy course notable for GBS positive. EFW done at 37w was 86%ile, her prior vaginal delivery was 8 lb 6 oz.  She is now on 6 mU/min of pitocin and feeling stronger contractions.    OB History    Gravida  5   Para  1   Term  1   Preterm      AB  3   Living  1     SAB  3   IAB      Ectopic      Multiple      Live Births  1          Past Medical History:  Diagnosis Date  . Anxiety    Past Surgical History:  Procedure Laterality Date  . NO PAST SURGERIES     Family History: family history includes Cancer in her mother; Healthy in her father. Social History:  reports that she has never smoked. She has never used smokeless tobacco. She reports previous alcohol use. She reports that she does not use drugs.     Maternal Diabetes: No Genetic Screening: Normal Maternal Ultrasounds/Referrals: Normal Fetal Ultrasounds or other Referrals:  None Maternal Substance Abuse:  No Significant Maternal Medications:  None Significant Maternal Lab Results:  Group B Strep positive Other Comments:  None  Review of Systems History Dilation: 1.5 Effacement (%): 60 Station: -3 Exam by:: E Chipps RN Blood pressure 106/70, pulse 80, temperature 97.8 F (36.6 C), temperature source Oral, resp. rate 20, height 5' 9.5" (1.765 m), weight 91 kg. Exam Physical Exam  Gen: alert, no distress Chest: nonlabored breaths Abdomen: gravid, soft, contractions Ext: no evidence of DVT Prenatal labs: ABO, Rh: --/--/O POS (03/29 0217) Antibody: NEG (03/29 0217) Rubella: Immune (09/02 0000) RPR:    HBsAg:    HIV: Non-reactive (09/02 0000)  GBS: Positive/-- (03/09 0000)   Assessment/Plan: . Admitted to Labor and Delivery . PCN almost complete . Pit currently 6 mU/min, FHT cat 1. Continue to titrate. Marland Kitchen Epidural when desired . Anticipate vaginal delivery.   Veronica Gutierrez 02/11/2021, 7:18 AM

## 2021-02-11 NOTE — Anesthesia Postprocedure Evaluation (Signed)
Anesthesia Post Note  Patient: Veronica Gutierrez  Procedure(s) Performed: AN AD HOC LABOR EPIDURAL     Patient location during evaluation: Mother Baby Anesthesia Type: Epidural Level of consciousness: awake Pain management: satisfactory to patient Vital Signs Assessment: post-procedure vital signs reviewed and stable Respiratory status: spontaneous breathing Cardiovascular status: stable Anesthetic complications: no   No complications documented.  Last Vitals:  Vitals:   02/11/21 1338 02/11/21 1454  BP: 107/83 114/74  Pulse: 62 68  Resp:  18  Temp:  36.8 C  SpO2:  97%    Last Pain:  Vitals:   02/11/21 1756  TempSrc:   PainSc: Asleep   Pain Goal:                   Cephus Shelling

## 2021-02-11 NOTE — Lactation Note (Signed)
This note was copied from a baby's chart. Lactation Consultation Note  Patient Name: Veronica Gutierrez [id number] Date: 02/11/2021 Reason for consult: Initial assessment;Mother's request;Term Age:36 hours LC went in to talk with Mom. Infant just fed prior to visit and sleeping in the bassinet. Last feeding around 50 minutes. LC reviewed with Mom how to assess if infant transferring milk looking for swallows and hearing CAH sound.   Mom's nipples abrasion and both breasts upper quadrant. Mom to EBM and use coconut oil for wound healing. RN, Lianne Cure aware and will provide coconut oil.   Mom to latch either football, cross cradle or prone. Mom to call RN or LC if she is unable to get a deep enough latch without pain.  All questions answered at the end of the visit.   Plan 1. To feed based on cues 8-12x in 24 hr period no more than 4 hrs without an attempt. Mom to offer both breasts, STS and look for signs of milk transfer.           2. I and O sheet reviewed.            3. LC brochure of inpatient and outpatient services reviewed.  Maternal Data Has patient been taught Hand Expression?: Yes Does the patient have breastfeeding experience prior to this delivery?: Yes How long did the patient breastfeed?: 19 months  Feeding Mother's Current Feeding Choice: Breast Milk  LATCH Score                    Lactation Tools Discussed/Used    Interventions Interventions: Breast feeding basics reviewed;Breast compression;Skin to skin;Breast massage;Position options;Expressed milk;Education;Hand express;Coconut oil  Discharge Pump: Personal WIC Program: No  Consult Status Consult Status: Follow-up Date: 02/12/21 Follow-up type: In-patient    Ayme Short  Nicholson-Springer 02/11/2021, 5:19 PM

## 2021-02-11 NOTE — Anesthesia Preprocedure Evaluation (Signed)
Anesthesia Evaluation  Patient identified by MRN, date of birth, ID band Patient awake    Reviewed: Allergy & Precautions, NPO status , Patient's Chart, lab work & pertinent test results  Airway Mallampati: II  TM Distance: >3 FB Neck ROM: Full    Dental no notable dental hx. (+) Teeth Intact, Dental Advisory Given   Pulmonary neg pulmonary ROS,    Pulmonary exam normal breath sounds clear to auscultation       Cardiovascular Exercise Tolerance: Good negative cardio ROS Normal cardiovascular exam Rhythm:Regular Rate:Normal     Neuro/Psych Anxiety negative neurological ROS     GI/Hepatic negative GI ROS, Neg liver ROS,   Endo/Other  negative endocrine ROS  Renal/GU negative Renal ROS     Musculoskeletal   Abdominal   Peds  Hematology Lab Results      Component                Value               Date                      WBC                      7.7                 02/11/2021                HGB                      12.7                02/11/2021                HCT                      36.2                02/11/2021                MCV                      89.6                02/11/2021                PLT                      172                 02/11/2021              Anesthesia Other Findings   Reproductive/Obstetrics (+) Pregnancy                             Anesthesia Physical Anesthesia Plan  ASA: II  Anesthesia Plan: Epidural   Post-op Pain Management:    Induction:   PONV Risk Score and Plan:   Airway Management Planned:   Additional Equipment:   Intra-op Plan:   Post-operative Plan:   Informed Consent: I have reviewed the patients History and Physical, chart, labs and discussed the procedure including the risks, benefits and alternatives for the proposed anesthesia with the patient or authorized representative who has indicated his/her understanding and acceptance.        Plan Discussed with: CRNA  Anesthesia Plan Comments: (39 wk  G5P1 for LEA)        Anesthesia Quick Evaluation

## 2021-02-11 NOTE — Lactation Note (Signed)
This note was copied from a baby's chart. Lactation Consultation Note  Patient Name: Girl Jazzlene Huot [id number] Date: 02/11/2021 Reason for consult: L&D Initial assessment;Difficult latch;Term;Mother's request Age:35 hours Infant fussy on arrival.  Trying to latch.  Minimal assist with breastfeeding on left breast in cross cradle hold.  Infant spitty.  Infant slides off the breast from all of her spit.  Infant fussy, Cuing, rooting, trying to latch. Wiped breast off and assisted with feeding.  Spoon fed a few ml to infant and then switched to other breast.  Infant latched easily and well.  Left breastfeeding.  Urged to call lactation as needed. Maternal Data Has patient been taught Hand Expression?: Yes Does the patient have breastfeeding experience prior to this delivery?: Yes How long did the patient breastfeed?: 19 months  Feeding Mother's Current Feeding Choice: Breast Milk  LATCH Score Latch: Grasps breast easily, tongue down, lips flanged, rhythmical sucking.  Audible Swallowing: A few with stimulation  Type of Nipple: Everted at rest and after stimulation  Comfort (Breast/Nipple): Soft / non-tender  Hold (Positioning): Assistance needed to correctly position infant at breast and maintain latch.  LATCH Score: 8   Lactation Tools Discussed/Used Tools:  (spoon)  Interventions    Discharge    Consult Status Consult Status: Follow-up Date: 02/11/21 Follow-up type: In-patient    Arasely Akkerman Michaelle Copas 02/11/2021, 12:30 PM

## 2021-02-11 NOTE — Anesthesia Procedure Notes (Signed)
Epidural Patient location during procedure: OB Start time: 02/11/2021 8:34 AM End time: 02/11/2021 8:51 AM  Staffing Anesthesiologist: Trevor Iha, MD Performed: anesthesiologist   Preanesthetic Checklist Completed: patient identified, IV checked, site marked, risks and benefits discussed, surgical consent, monitors and equipment checked, pre-op evaluation and timeout performed  Epidural Patient position: sitting Prep: DuraPrep and site prepped and draped Patient monitoring: continuous pulse ox and blood pressure Approach: midline Location: L3-L4 Injection technique: LOR air  Needle:  Needle type: Tuohy  Needle gauge: 17 G Needle length: 9 cm and 9 Needle insertion depth: 5 cm cm Catheter type: closed end flexible Catheter size: 19 Gauge Catheter at skin depth: 11 cm Test dose: negative  Assessment Events: blood not aspirated, injection not painful, no injection resistance, no paresthesia and negative IV test  Additional Notes Patient identified. Risks/Benefits/Options discussed with patient including but not limited to bleeding, infection, nerve damage, paralysis, failed block, incomplete pain control, headache, blood pressure changes, nausea, vomiting, reactions to medication both or allergic, itching and postpartum back pain. Confirmed with bedside nurse the patient's most recent platelet count. Confirmed with patient that they are not currently taking any anticoagulation, have any bleeding history or any family history of bleeding disorders. Patient expressed understanding and wished to proceed. All questions were answered. Sterile technique was used throughout the entire procedure. Please see nursing notes for vital signs. Test dose was given through epidural needle and negative prior to continuing to dose epidural or start infusion. Warning signs of high block given to the patient including shortness of breath, tingling/numbness in hands, complete motor block, or any  concerning symptoms with instructions to call for help. Patient was given instructions on fall risk and not to get out of bed. All questions and concerns addressed with instructions to call with any issues.  1 Attempt (S) . Patient tolerated procedure well.

## 2021-02-11 NOTE — Progress Notes (Signed)
Lactation at bedside. °

## 2021-02-11 NOTE — Lactation Note (Signed)
Lactation Consultation Note  Patient Name: Veronica Gutierrez [id number] Date: 02/11/2021 Reason for consult: L&D Initial assessment;Mother's request;Difficult latch Age:35 y.o.  Maternal Data Does the patient have breastfeeding experience prior to this delivery?: Yes How long did the patient breastfeed?: 19 months  Feeding Mother's Current Feeding Choice: Breast Milk  LATCH Score Latch: Grasps breast easily, tongue down, lips flanged, rhythmical sucking. (infant spitty/wants to eat/slides off and on)  Audible Swallowing: A few with stimulation  Type of Nipple: Everted at rest and after stimulation  Comfort (Breast/Nipple): Soft / non-tender  Hold (Positioning): Assistance needed to correctly position infant at breast and maintain latch.  LATCH Score: 8   Lactation Tools Discussed/Used Tools:  (spoon)  Interventions Interventions: Assisted with latch;Breast feeding basics reviewed;Hand express;Adjust position  Discharge    Consult Status Consult Status: Follow-up Date: 02/11/21 Follow-up type: In-patient    [facility] Michaelle Copas 02/11/2021, 12:26 PM

## 2021-02-11 NOTE — MAU Note (Signed)
PT SAYS SROM AT 2330- CLEAR FLUID WHILE LAYING IN BED- WHEN SHE GOT UP = PANTS WERE WET.  PNC - WITH DR MORRIS- VE  1-2 CM.  DENIES HSV AND MRSA.  GBS- POSITIVE.  FEELS MILD CRAMPS.

## 2021-02-12 LAB — CBC
HCT: 34.8 % — ABNORMAL LOW (ref 36.0–46.0)
Hemoglobin: 11.7 g/dL — ABNORMAL LOW (ref 12.0–15.0)
MCH: 30.7 pg (ref 26.0–34.0)
MCHC: 33.6 g/dL (ref 30.0–36.0)
MCV: 91.3 fL (ref 80.0–100.0)
Platelets: 143 10*3/uL — ABNORMAL LOW (ref 150–400)
RBC: 3.81 MIL/uL — ABNORMAL LOW (ref 3.87–5.11)
RDW: 13.2 % (ref 11.5–15.5)
WBC: 8.7 10*3/uL (ref 4.0–10.5)
nRBC: 0 % (ref 0.0–0.2)

## 2021-02-12 NOTE — Discharge Summary (Signed)
Postpartum Discharge Summary  Date of Service February 12, 2021     Patient Name: Veronica Gutierrez DOB: 01/13/1986 MRN: [id number]  Date of admission: 02/11/2021 Delivery date:02/11/2021  Delivering provider: Eyvonne Mechanic A  Date of discharge: 02/12/2021  Admitting diagnosis: Normal labor and delivery [O80] Intrauterine pregnancy: [redacted]w[redacted]d    Secondary diagnosis:  Active Problems:   Normal labor and delivery  Additional problems: none    Discharge diagnosis: Term Pregnancy Delivered                                              Post partum procedures:none Augmentation: Pitocin Complications: None  Hospital course: Onset of Labor With Vaginal Delivery      35y.o. yo [id number] admitted in Active Labor on 02/11/2021. Patient had an uncomplicated labor course as follows:  Membrane Rupture Time/Date: 11:30 PM ,02/11/2021   Delivery Method:Vaginal, Spontaneous  Episiotomy: None  Lacerations:  1st degree;Periurethral;Perineal  Patient had an uncomplicated postpartum course.  She is ambulating, tolerating a regular diet, passing flatus, and urinating well. Patient is discharged home in stable condition on 02/12/21.  Newborn Data: Birth date:02/11/2021  Birth time:11:20 AM  Gender:Female  Living status:Living  Apgars:8 ,9  Weight:3572 g   Magnesium Sulfate received: No BMZ received: No Rhophylac:No MMR:No T-DaP:Given prenatally Flu: No Transfusion:No  Physical exam  Vitals:   02/11/21 1824 02/11/21 2230 02/12/21 0236 02/12/21 0602  BP: 99/63 122/72 108/70 105/75  Pulse: 70 69 70 72  Resp: 17 18 16 18   Temp: 98.7 F (37.1 C) 98.4 F (36.9 C) 98 F (36.7 C) 98.3 F (36.8 C)  TempSrc: Oral Oral Oral Oral  SpO2: 99% 99% 97% 97%  Weight:      Height:       General: alert, cooperative and no distress Lochia: appropriate Uterine Fundus: firm Incision: Healing well with no significant drainage DVT Evaluation: No evidence of DVT seen on physical  exam. Labs: Lab Results  Component Value Date   WBC 7.7 02/11/2021   HGB 12.7 02/11/2021   HCT 36.2 02/11/2021   MCV 89.6 02/11/2021   PLT 172 02/11/2021   No flowsheet data found. Edinburgh Score: Edinburgh Postnatal Depression Scale Screening Tool 02/11/2021  I have been able to laugh and see the funny side of things. 0  I have looked forward with enjoyment to things. 0  I have blamed myself unnecessarily when things went wrong. 1  I have been anxious or worried for no good reason. 2  I have felt scared or panicky for no good reason. 0  Things have been getting on top of me. 0  I have been so unhappy that I have had difficulty sleeping. 0  I have felt sad or miserable. 0  I have been so unhappy that I have been crying. 0  The thought of harming myself has occurred to me. 0  Edinburgh Postnatal Depression Scale Total 3      After visit meds:  Allergies as of 02/12/2021   No Known Allergies     Medication List    TAKE these medications   acetaminophen 500 MG tablet Commonly known as: TYLENOL Take 1,000 mg by mouth every 6 (six) hours as needed for mild pain or headache.   calcium carbonate 500 MG chewable tablet Commonly known as: TUMS - dosed in mg elemental  calcium Chew 2 tablets by mouth 3 (three) times daily as needed for indigestion or heartburn.   doxylamine (Sleep) 25 MG tablet Commonly known as: UNISOM Take 25 mg by mouth at bedtime as needed for sleep.   prenatal multivitamin Tabs tablet Take 1 tablet by mouth at bedtime.   sertraline 100 MG tablet Commonly known as: ZOLOFT Take 100 mg by mouth at bedtime.        Discharge home in stable condition Infant Feeding: Breast Infant Disposition:home with mother Discharge instruction: per After Visit Summary and Postpartum booklet. Activity: Advance as tolerated. Pelvic rest for 6 weeks.  Diet: routine diet Anticipated Birth Control: Unsure Postpartum Appointment:6 weeks Additional Postpartum F/U:  none Future Appointments:No future appointments. Follow up Visit:      02/12/2021 Cyril Mourning, MD

## 2021-02-12 NOTE — Social Work (Signed)
CSW received consult for hx of Anxiety and Postpartum Depression.  CSW met with MOB to offer support and complete assessment.     CSW introduced self and role. CSW observed MOB holding infant 'Blakely.' CSW informed MOB reason for consult. MOB reported she was diagnosed with anxiety in High School. MOB stated she experienced baby blues which only lasted a couple of weeks in 2019. MOB reported she has been on Sertraline 100mg, prescribed by her PCP for a long time and she finds it to be helpful. MOB denies ever attending therapy. MOB identified FOB, her mother and sister as supports. MOB denies any current SI, HI or being involved in DV.   CSW provided education regarding the baby blues period versus PPD. CSW provided the New Mom Checklist and encouraged MOB to self evaluate and contact a medical professional if symptoms are noted at any time.  CSW provided review of Sudden Infant Death Syndrome (SIDS) precautions. MOB stated she has all essentials for infant. MOB identified [facility] for follow-up care and denies any barriers. MOB expressed no additional needs at this time.  CSW identifies no further need for intervention and no barriers to discharge at this time.  Marrian Bells, LCSWA Clinical Social Work [facility] [phone] 

## 2021-02-12 NOTE — Lactation Note (Signed)
This note was copied from a baby's chart. Lactation Consultation Note  Patient Name: Girl Debbora Ang VFIEP'P Date: 02/12/2021 Reason for consult: Follow-up assessment Age:35 hours   P2 mother whose infant is now 40 hours old.  This is a term baby at 39+0 weeks.  Mother breast fed her first child for 19 months.  Baby was awake in father's arms when I arrived.  Answered a few questions the parents had and reviewed feeding plan.  Mother stated that baby is starting to latch and feed better.  Discussed cluster feeding for tonight.  Last LATCH score was 10 and baby has 1 void and 3 stools.    Mother has been using EBM and coconut oil for nipple comfort.  Provided comfort gels to take home with her.  Instructions given.  Asked parents to call me for any further questions/concerns.  Family hoping to be discharged today.   Maternal Data    Feeding    LATCH Score                    Lactation Tools Discussed/Used    Interventions Interventions: Education  Discharge    Consult Status Consult Status: Complete Date: 02/12/21 Follow-up type: Call as needed    Irene Pap Kiyanna Biegler 02/12/2021, 2:38 PM

## 2021-02-14 ENCOUNTER — Inpatient Hospital Stay (HOSPITAL_COMMUNITY): Payer: BC Managed Care – PPO

## 2021-02-14 ENCOUNTER — Inpatient Hospital Stay (HOSPITAL_COMMUNITY)
Admission: AD | Admit: 2021-02-14 | Payer: BC Managed Care – PPO | Source: Home / Self Care | Admitting: Obstetrics & Gynecology

## 2021-10-19 IMAGING — US US MFM FETAL BPP W/O NON-STRESS
1 series · 12 of 28 positions shown · non-contrast
Comparison: none

[Series 1: us mfm fetal bpp w/o non-stress · 30 acquisitions, 12 frames shown]
[im 2/30]
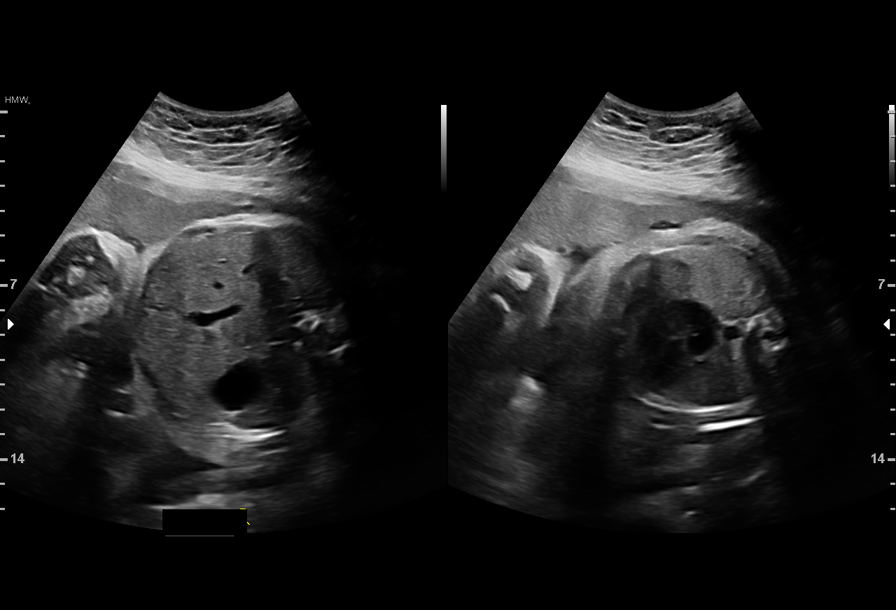
[im 4/30]
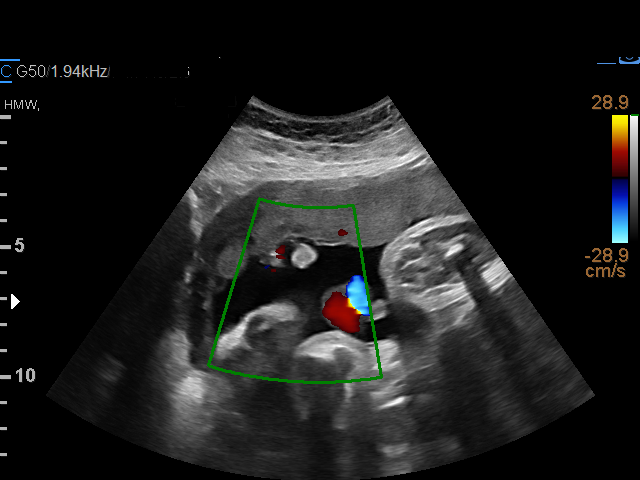
[im 6/30]
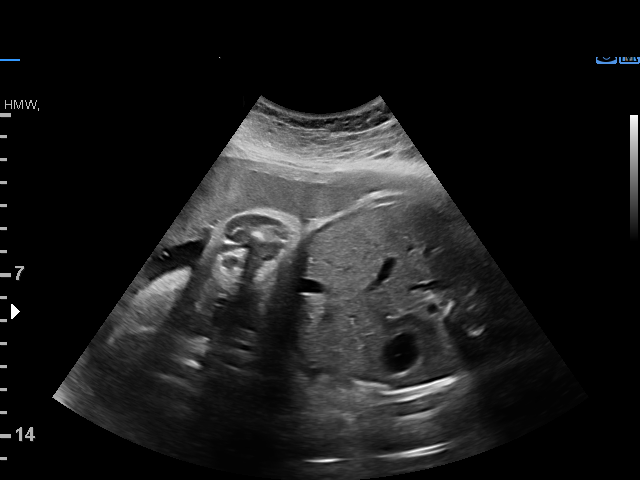
[im 9/30]
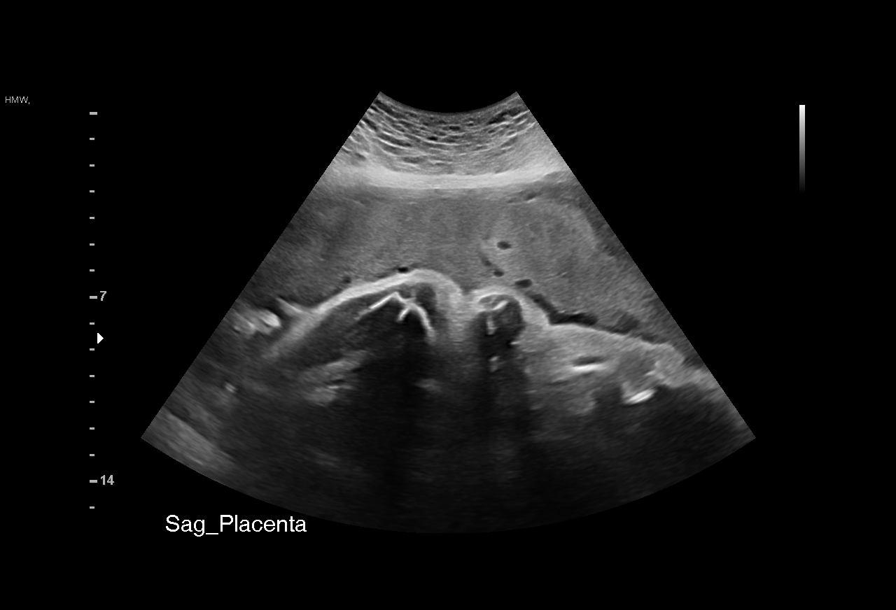
[im 11/30]
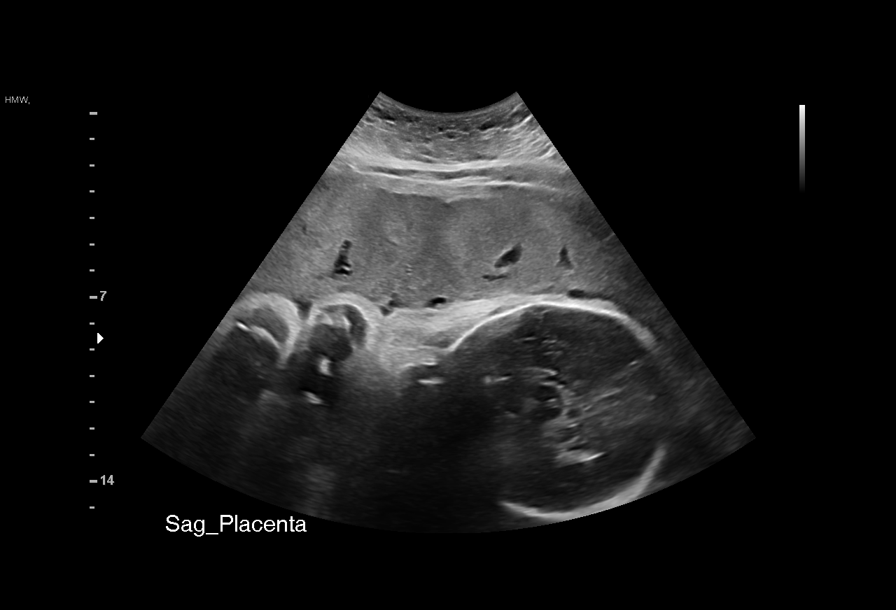
[im 13/30]
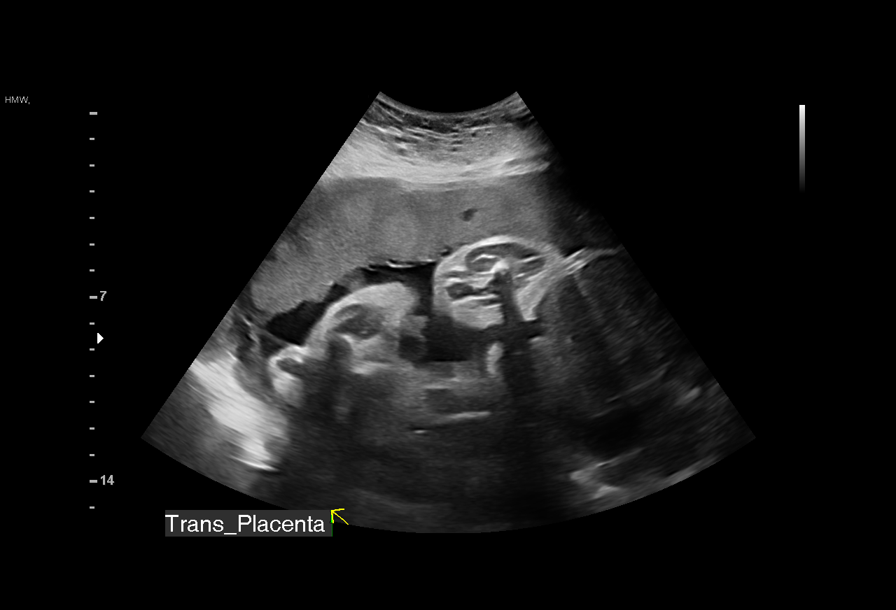
[im 17/30]
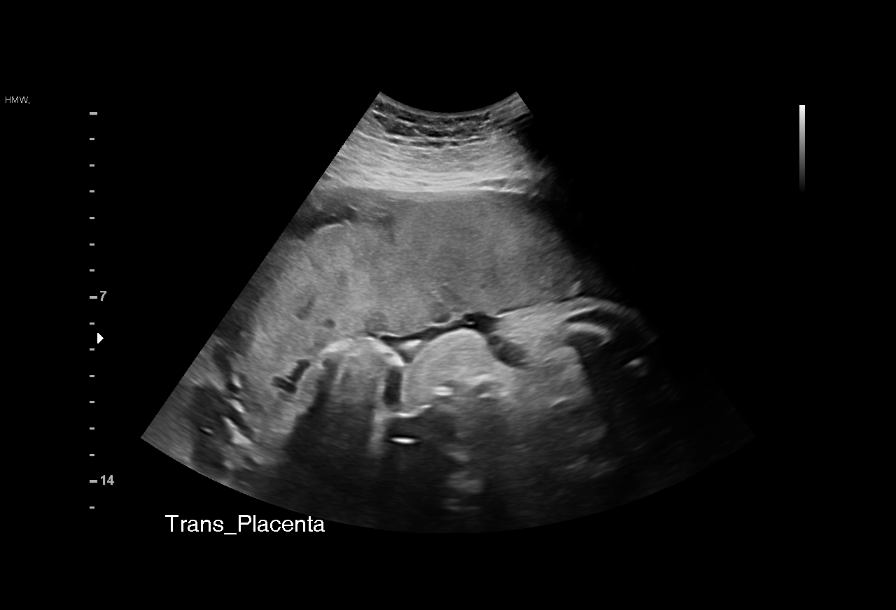
[im 19/30]
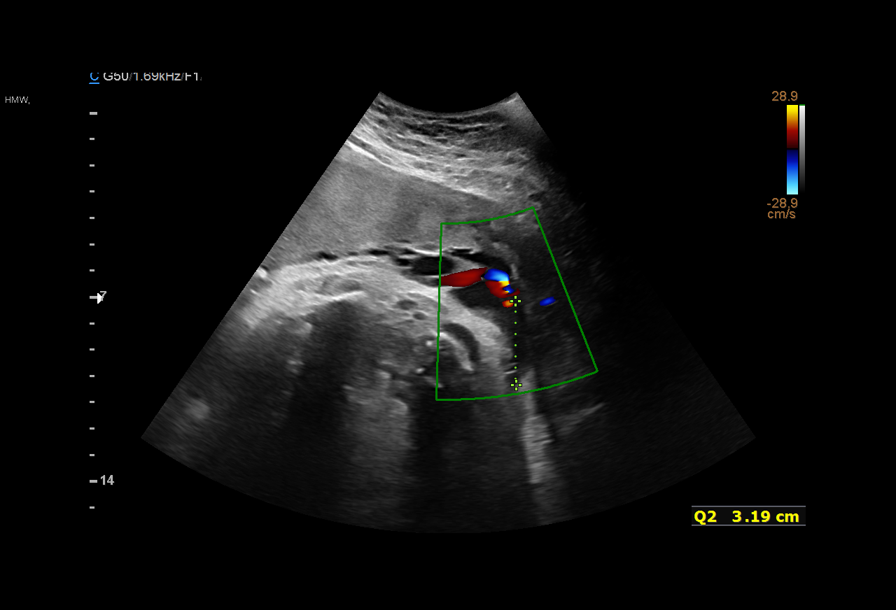
[im 21/30]
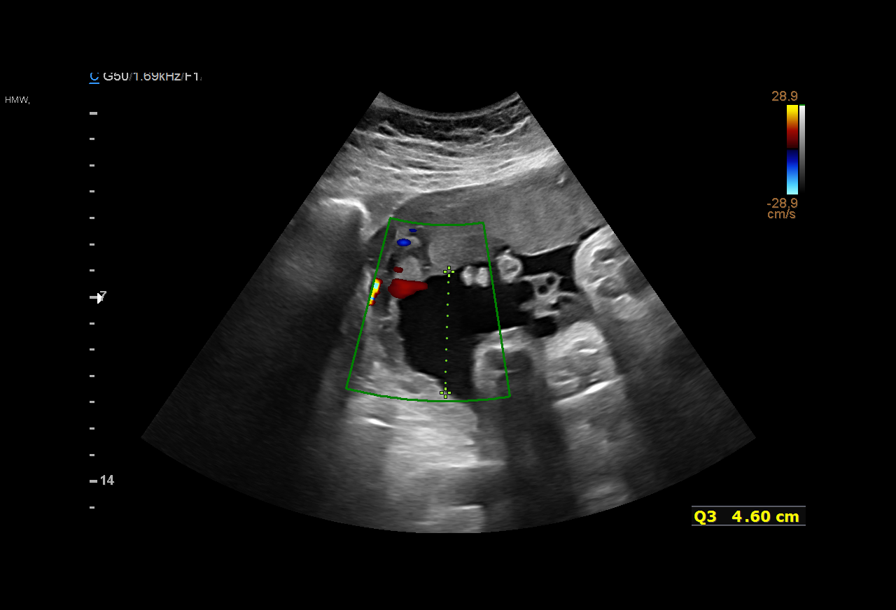
[im 24/30]
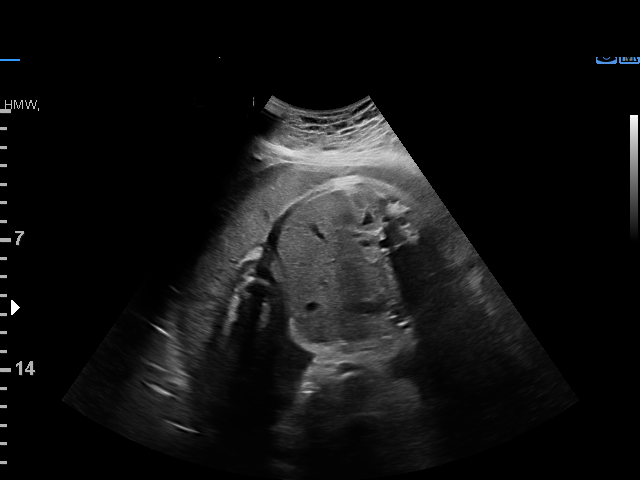
[im 26/30]
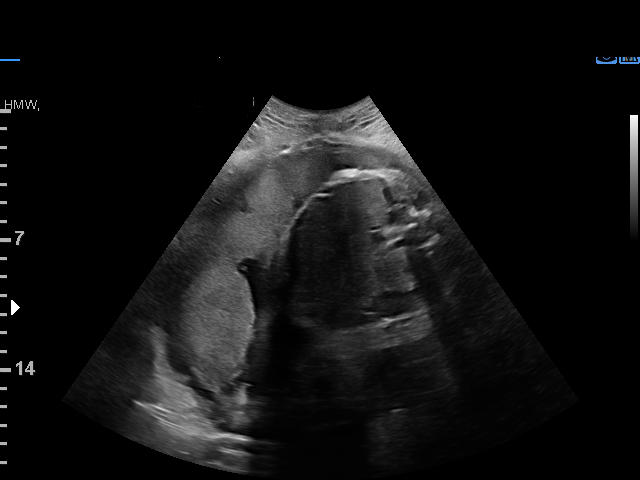
[im 28/30]
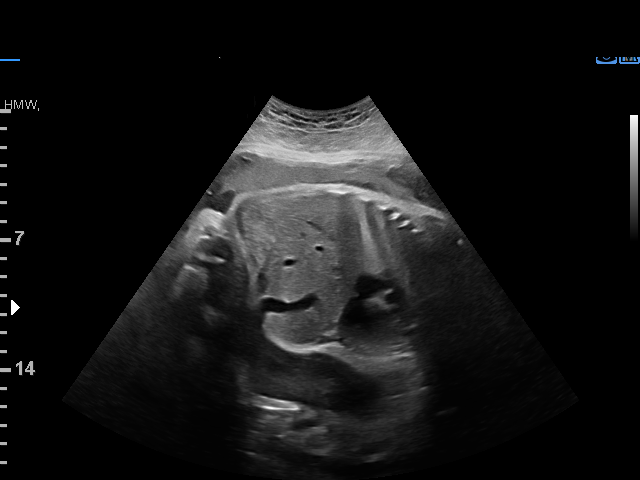

[12 of 28 positions shown; findings below may reference images not displayed]

Indications

 33 weeks gestation of pregnancy
 Decreased fetal movement
Fetal Evaluation

 Num Of Fetuses:         1
 Fetal Heart Rate(bpm):  140
 Cardiac Activity:       Observed
 Presentation:           Cephalic
 Placenta:               Anterior

 Amniotic Fluid
 AFI FV:      Within normal limits

 AFI Sum(cm)     %Tile       Largest Pocket(cm)
 14.3            50

 RUQ(cm)       RLQ(cm)       LUQ(cm)        LLQ(cm)

Biophysical Evaluation

 Amniotic F.V:   Within normal limits       F. Tone:        Observed
 F. Movement:    Observed                   Score:          [DATE]
 F. Breathing:   Observed
OB History

 Gravidity:    5         Term:   1        Prem:   0        SAB:   3
 TOP:          0       Ectopic:  0        Living: 1
Gestational Age
 Clinical EDD:  33w 0d                                        EDD:   02/18/21
 Best:          33w 0d     Det. By:  Clinical EDD             EDD:   02/18/21
Anatomy

 Thoracic:              Appears normal         Bladder:                Appears normal
 Stomach:               Appears normal, left
                        sided
Impression

 Limited exam to assess decreased fetal movement
 Biophysical profile [DATE] with good fetal movement and
 amniotic fluid volume
 Consider daily maternal Lukina Hossine.
Recommendations

 Follow up as clinically indicated.
